# Patient Record
Sex: Male | Born: 1970 | Race: Black or African American | Hispanic: No | Marital: Single | Smoking: Current every day smoker
Health system: Southern US, Community
[De-identification: ages and names within clinical notes are randomized; demographics above are authoritative.]

---

## 2015-07-21 ENCOUNTER — Encounter: Payer: Self-pay | Admitting: Emergency Medicine

## 2015-07-21 ENCOUNTER — Emergency Department
Admission: EM | Admit: 2015-07-21 | Discharge: 2015-07-21 | Disposition: A | Discharge status: Home / Self Care | Payer: No Typology Code available for payment source | Attending: Emergency Medicine | Admitting: Emergency Medicine

## 2015-07-21 ENCOUNTER — Emergency Department: Payer: No Typology Code available for payment source

## 2015-07-21 DIAGNOSIS — Y998 Other external cause status: Secondary | ICD-10-CM | POA: Diagnosis not present

## 2015-07-21 DIAGNOSIS — Y9389 Activity, other specified: Secondary | ICD-10-CM | POA: Insufficient documentation

## 2015-07-21 DIAGNOSIS — S161XXA Strain of muscle, fascia and tendon at neck level, initial encounter: Secondary | ICD-10-CM | POA: Diagnosis not present

## 2015-07-21 DIAGNOSIS — F17210 Nicotine dependence, cigarettes, uncomplicated: Secondary | ICD-10-CM | POA: Insufficient documentation

## 2015-07-21 DIAGNOSIS — S199XXA Unspecified injury of neck, initial encounter: Secondary | ICD-10-CM | POA: Diagnosis present

## 2015-07-21 DIAGNOSIS — Y92481 Parking lot as the place of occurrence of the external cause: Secondary | ICD-10-CM | POA: Diagnosis not present

## 2015-07-21 MED ORDER — CYCLOBENZAPRINE HCL 10 MG PO TABS: 10.0000 mg | ORAL_TABLET | Freq: Three times a day (TID) | ORAL | Status: DC | PRN

## 2015-07-21 MED ORDER — CYCLOBENZAPRINE HCL 10 MG PO TABS
10.0000 mg | ORAL_TABLET | Freq: Once | ORAL | Status: AC
  Administered 2015-07-21: 10 mg via ORAL
  Filled 2015-07-21: qty 1

## 2015-07-21 MED ORDER — IBUPROFEN 800 MG PO TABS: 800.0000 mg | ORAL_TABLET | Freq: Three times a day (TID) | ORAL | Status: AC | PRN

## 2015-07-21 MED ORDER — HYDROCODONE-ACETAMINOPHEN 5-325 MG PO TABS
2.0000 | ORAL_TABLET | Freq: Once | ORAL | Status: AC
  Administered 2015-07-21: 2 via ORAL
  Filled 2015-07-21: qty 2
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
## 2015-07-21 NOTE — Discharge Instructions (Signed)
Cervical Sprain  A cervical sprain is an injury in the neck in which the strong, fibrous tissues (ligaments) that connect your neck bones stretch or tear. Cervical sprains can range from mild to severe. Severe cervical sprains can cause the neck vertebrae to be unstable. This can lead to damage of the spinal cord and can result in serious nervous system problems. The amount of time it takes for a cervical sprain to get better depends on the cause and extent of the injury. Most cervical sprains heal in 1 to 3 weeks.  CAUSES   Severe cervical sprains may be caused by:    Contact sport injuries (such as from football, rugby, wrestling, hockey, auto racing, gymnastics, diving, martial arts, or boxing).    Motor vehicle collisions.    Whiplash injuries. This is an injury from a sudden forward and backward whipping movement of the head and neck.   Falls.   Mild cervical sprains may be caused by:    Being in an awkward position, such as while cradling a telephone between your ear and shoulder.    Sitting in a chair that does not offer proper support.    Working at a poorly designed computer station.    Looking up or down for long periods of time.   SYMPTOMS    Pain, soreness, stiffness, or a burning sensation in the front, back, or sides of the neck. This discomfort may develop immediately after the injury or slowly, 24 hours or more after the injury.    Pain or tenderness directly in the middle of the back of the neck.    Shoulder or upper back pain.    Limited ability to move the neck.    Headache.    Dizziness.    Weakness, numbness, or tingling in the hands or arms.    Muscle spasms.    Difficulty swallowing or chewing.    Tenderness and swelling of the neck.   DIAGNOSIS   Most of the time your health care provider can diagnose a cervical sprain by taking your history and doing a physical exam. Your health care provider will ask about previous neck injuries and any known neck  problems, such as arthritis in the neck. X-rays may be taken to find out if there are any other problems, such as with the bones of the neck. Other tests, such as a CT scan or MRI, may also be needed.   TREATMENT   Treatment depends on the severity of the cervical sprain. Mild sprains can be treated with rest, keeping the neck in place (immobilization), and pain medicines. Severe cervical sprains are immediately immobilized. Further treatment is done to help with pain, muscle spasms, and other symptoms and may include:   Medicines, such as pain relievers, numbing medicines, or muscle relaxants.    Physical therapy. This may involve stretching exercises, strengthening exercises, and posture training. Exercises and improved posture can help stabilize the neck, strengthen muscles, and help stop symptoms from returning.   HOME CARE INSTRUCTIONS    Put ice on the injured area.     Put ice in a plastic bag.     Place a towel between your skin and the bag.     Leave the ice on for 15-20 minutes, 3-4 times a day.    If your injury was severe, you may have been given a cervical collar to wear. A cervical collar is a two-piece collar designed to keep your neck from moving while it heals.      Do not remove the collar unless instructed by your health care provider.    If you have long hair, keep it outside of the collar.    Ask your health care provider before making any adjustments to your collar. Minor adjustments may be required over time to improve comfort and reduce pressure on your chin or on the back of your head.    Ifyou are allowed to remove the collar for cleaning or bathing, follow your health care provider's instructions on how to do so safely.    Keep your collar clean by wiping it with mild soap and water and drying it completely. If the collar you have been given includes removable pads, remove them every 1-2 days and hand wash them with soap and water. Allow them to air dry. They should be completely  dry before you wear them in the collar.    If you are allowed to remove the collar for cleaning and bathing, wash and dry the skin of your neck. Check your skin for irritation or sores. If you see any, tell your health care provider.    Do not drive while wearing the collar.    Only take over-the-counter or prescription medicines for pain, discomfort, or fever as directed by your health care provider.    Keep all follow-up appointments as directed by your health care provider.    Keep all physical therapy appointments as directed by your health care provider.    Make any needed adjustments to your workstation to promote good posture.    Avoid positions and activities that make your symptoms worse.    Warm up and stretch before being active to help prevent problems.   SEEK MEDICAL CARE IF:    Your pain is not controlled with medicine.    You are unable to decrease your pain medicine over time as planned.    Your activity level is not improving as expected.   SEEK IMMEDIATE MEDICAL CARE IF:    You develop any bleeding.   You develop stomach upset.   You have signs of an allergic reaction to your medicine.    Your symptoms get worse.    You develop new, unexplained symptoms.    You have numbness, tingling, weakness, or paralysis in any part of your body.   MAKE SURE YOU:    Understand these instructions.   Will watch your condition.   Will get help right away if you are not doing well or get worse.     This information is not intended to replace advice given to you by your health care provider. Make sure you discuss any questions you have with your health care provider.     Document Released: 05/29/2007 Document Revised: 08/06/2013 Document Reviewed: 02/06/2013  [employer]vier Interactive Patient Education 2016 [employer]
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
## 2016-03-27 ENCOUNTER — Emergency Department
Admission: EM | Admit: 2016-03-27 | Discharge: 2016-03-27 | Disposition: A | Discharge status: Home / Self Care | Payer: BLUE CROSS/BLUE SHIELD | Attending: Emergency Medicine | Admitting: Emergency Medicine

## 2016-03-27 ENCOUNTER — Encounter: Payer: Self-pay | Admitting: *Deleted

## 2016-03-27 DIAGNOSIS — M62838 Other muscle spasm: Secondary | ICD-10-CM

## 2016-03-27 DIAGNOSIS — Z791 Long term (current) use of non-steroidal anti-inflammatories (NSAID): Secondary | ICD-10-CM | POA: Diagnosis not present

## 2016-03-27 DIAGNOSIS — F17210 Nicotine dependence, cigarettes, uncomplicated: Secondary | ICD-10-CM | POA: Insufficient documentation

## 2016-03-27 DIAGNOSIS — M62830 Muscle spasm of back: Secondary | ICD-10-CM | POA: Insufficient documentation

## 2016-03-27 DIAGNOSIS — M25511 Pain in right shoulder: Secondary | ICD-10-CM | POA: Diagnosis present

## 2016-03-27 MED ORDER — NAPROXEN 500 MG PO TABS: 500.0000 mg | ORAL_TABLET | Freq: Two times a day (BID) | ORAL | 0 refills | Status: AC

## 2016-03-27 MED ORDER — CYCLOBENZAPRINE HCL 10 MG PO TABS: 10.0000 mg | ORAL_TABLET | Freq: Three times a day (TID) | ORAL | 0 refills | Status: AC | PRN

## 2016-03-27 NOTE — ED Notes (Signed)
States he developed some pain to back of neck and into right shoulder about 2 weeks ago. W/o injury  Pain eases off with OTC ibu   Pain is worse into mornings

## 2016-03-27 NOTE — ED Triage Notes (Signed)
Pt complains of right shoulder/neck pain for two weeks, pt denies fever and any other symptoms

## 2016-03-27 NOTE — ED Provider Notes (Signed)
[facility] Emergency Department Provider Note  ____________________________________________  Time seen: Approximately 10:45 AM  I have reviewed the triage vital signs and the nursing notes.   HISTORY  Chief Complaint Shoulder Pain    HPI Raynaldo Falco is a 45 y.o. male , NAD, presents to the emergency department with 2 week history of right shoulder pain. Patient states he woke approximately 2 weeks ago with right neck and shoulder pain. Since that time pain has spread into the right upper arm. Has not had any changes in range of motion and has had normal grip strength. Denies any injuries or falls. Has not noted any skin sores or rashes. Denies chest pain, palpitations, shortness of breath, numbness, weakness, tingling. Has not had any changes in vision. Has not taken anything over-the-counter for his symptoms.   History reviewed. No pertinent past medical history.  There are no active problems to display for this patient.   History reviewed. No pertinent surgical history.  Prior to Admission medications   Medication Sig Start Date End Date Taking? Authorizing Provider  cyclobenzaprine (FLEXERIL) 10 MG tablet Take 1 tablet (10 mg total) by mouth 3 (three) times daily as needed for muscle spasms. 03/27/16   Rinda Rollyson L Jaceion Aday, PA-C  ibuprofen (ADVIL,MOTRIN) 800 MG tablet Take 1 tablet (800 mg total) by mouth every 8 (eight) hours as needed. 07/21/15   Charmayne Sheer Beers, PA-C  naproxen (NAPROSYN) 500 MG tablet Take 1 tablet (500 mg total) by mouth 2 (two) times daily with a meal. 03/27/16   Rane Dumm L Kyser Wandel, PA-C    Allergies Review of patient's allergies indicates no known allergies.  No family history on file.  Social History Social History  Substance Use Topics  . Smoking status: Current Every Day Smoker    Packs/day: 0.50    Types: Cigarettes  . Smokeless tobacco: Never Used  . Alcohol use Yes     Review of Systems  Constitutional: No  fever/chills Eyes: No visual changes.  Cardiovascular: No chest pain, palpitations. Respiratory: No shortness of breath. No wheezing.  Gastrointestinal: No abdominal pain.  No nausea, vomiting.  Musculoskeletal: Positive neck, right shoulder pain. Negative for back pain.  Skin: Negative for rash, redness, swelling, bruising, skin sores. Neurological: Negative for headaches, focal weakness or numbness. No tingling 10-point ROS otherwise negative.  ____________________________________________   PHYSICAL EXAM:  VITAL SIGNS: ED Triage Vitals  Enc Vitals Group     BP 03/27/16 1037 (!) 158/88     Pulse Rate 03/27/16 1037 88     Resp 03/27/16 1037 18     Temp 03/27/16 1037 98.3 F (36.8 C)     Temp src --      SpO2 03/27/16 1037 100 %     Weight 03/27/16 1034 187 lb (84.8 kg)     Height 03/27/16 1034  (1.88 m)     Head Circumference --      Peak Flow --      Pain Score 03/27/16 1034 6     Pain Loc --      Pain Edu? --      Excl. in GC? --      Constitutional: Alert and oriented. Well appearing and in no acute distress. Eyes: Conjunctivae are normal without icterus or injection Head: Atraumatic. Neck: Stridor, carotid bruits. No cervical spine tenderness to palpation. Supple with full range of motion. Right trapezial muscle spasm to palpation with mild tenderness. Hematological/Lymphatic/Immunilogical: No cervical lymphadenopathy. Cardiovascular: Normal rate, regular rhythm. Normal  S1 and S2. No murmurs, rubs, gallops. Good peripheral circulation. Respiratory: Normal respiratory effort without tachypnea or retractions. Lungs CTAB with breath sounds noted in all lung fields. Musculoskeletal: Full range of motion of bilateral upper extremities without pain or difficulty. Strength of bilateral upper extremities is 5 out of 5. No tenderness about the deltoid. Neurologic:  Normal speech and language. No gross focal neurologic deficits are appreciated.  Skin:  Skin is warm, dry  and intact. No rash, redness, bruising, skin sores noted. Psychiatric: Mood and affect are normal. Speech and behavior are normal. Patient exhibits appropriate insight and judgement.   ____________________________________________   LABS  None ____________________________________________  EKG  None ____________________________________________  RADIOLOGY  None ____________________________________________    PROCEDURES  Procedure(s) performed: None   Procedures   Medications - No data to display   ____________________________________________   INITIAL IMPRESSION / ASSESSMENT AND PLAN / ED COURSE  Pertinent labs & imaging results that were available during my care of the patient were reviewed by me and considered in my medical decision making (see chart for details).  Clinical Course    Patient's diagnosis is consistent with trapezial muscle spasm. Patient will be discharged home with prescriptions for Flexeril and Naprosyn to take as directed. Patient is to [facility] up with [facility] if symptoms persist past this treatment course. Patient is given ED precautions to return to the ED for any worsening or new symptoms.    ____________________________________________  FINAL CLINICAL IMPRESSION(S) / ED DIAGNOSES  Final diagnoses:  Trapezius muscle spasm      NEW MEDICATIONS STARTED DURING THIS VISIT:  New Prescriptions   CYCLOBENZAPRINE (FLEXERIL) 10 MG TABLET    Take 1 tablet (10 mg total) by mouth 3 (three) times daily as needed for muscle spasms.   NAPROXEN (NAPROSYN) 500 MG TABLET    Take 1 tablet (500 mg total) by mouth 2 (two) times daily with a meal.         Hope PigeonJami L Tran Randle, PA-C 03/27/16 1109    Minna AntisKevin Paduchowski, MD 03/27/16 [phone]
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
